# Patient Record
Sex: Male | Born: 1965 | Race: White | Hispanic: No | Marital: Married
Health system: Southern US, Community
[De-identification: ages and names within clinical notes are randomized; demographics above are authoritative.]

---

## 2021-06-02 ENCOUNTER — Other Ambulatory Visit (HOSPITAL_COMMUNITY): Payer: Self-pay | Admitting: Cardiology

## 2021-06-02 DIAGNOSIS — I517 Cardiomegaly: Secondary | ICD-10-CM

## 2021-06-02 DIAGNOSIS — I272 Pulmonary hypertension, unspecified: Secondary | ICD-10-CM

## 2021-06-22 ENCOUNTER — Telehealth (HOSPITAL_COMMUNITY): Payer: Self-pay | Admitting: Emergency Medicine

## 2021-06-22 NOTE — Telephone Encounter (Signed)
Reaching out to patient to offer assistance regarding upcoming cardiac imaging study; pt verbalizes understanding of appt date/time, parking situation and where to check in and verified current allergies; name and call back number provided for further questions should they arise Marchia Bond RN Navigator Cardiac Imaging [facility] [phone] office [phone] cell   Denies metal implants Denies iv issues Denies claustrophobia Arrival time 1130

## 2021-06-23 ENCOUNTER — Ambulatory Visit (HOSPITAL_COMMUNITY): Payer: BC Managed Care – PPO

## 2021-07-27 ENCOUNTER — Telehealth (HOSPITAL_COMMUNITY): Payer: Self-pay | Admitting: *Deleted

## 2021-07-27 NOTE — Telephone Encounter (Signed)
Reaching out to patient to offer assistance regarding upcoming cardiac imaging study; pt verbalizes understanding of appt date/time, parking situation and where to check in, and verified current allergies; name and call back number provided for further questions should they arise  Trew Sunde RN Navigator Cardiac Imaging [facility] [phone] office [phone] cell  Patient denies metal or claustrophobia. 

## 2021-07-28 ENCOUNTER — Other Ambulatory Visit: Payer: Self-pay

## 2021-07-28 ENCOUNTER — Ambulatory Visit (HOSPITAL_COMMUNITY)
Admission: RE | Admit: 2021-07-28 | Discharge: 2021-07-28 | Disposition: A | Discharge status: Home / Self Care | Payer: BC Managed Care – PPO | Source: Ambulatory Visit | Attending: Cardiology | Admitting: Cardiology

## 2021-07-28 DIAGNOSIS — I517 Cardiomegaly: Secondary | ICD-10-CM | POA: Diagnosis not present

## 2021-07-28 DIAGNOSIS — I272 Pulmonary hypertension, unspecified: Secondary | ICD-10-CM | POA: Insufficient documentation

## 2021-07-28 MED ORDER — GADOBUTROL 1 MMOL/ML IV SOLN
10.0000 mL | Freq: Once | INTRAVENOUS | Status: AC | PRN
  Administered 2021-07-28: 10 mL via INTRAVENOUS

## 2024-01-29 IMAGING — MR MR CARD MORPHOLOGY WO/W CM
45 of 48 series · 45 of 48 positions shown · IV contrast (gadavist)
Comparison: none

CLINICAL DATA: Clinical question of right ventricular dilation
Study assumes HCT of 42 and BSA of 2.18 m2.

EXAM:
CARDIAC MRI
TECHNIQUE: The patient was scanned on a 1.5 Tesla GE magnet. A dedicated
cardiac coil was used. Functional imaging was done using [employer]
sequences. [DATE], and 4 chamber views were done to assess for RWMA's.
Modified Shawnjay rule using a short axis stack was used to
calculate an ejection fraction on a dedicated work station using
[employer] software. The patient received 10 cc of Gadavist. After 10
minutes inversion recovery sequences were used to assess for
infiltration and scar tissue.
CONTRAST:  10 cc  of Gadavist

[Series 4: t2_haste_db_tra_bh · axial · 8.0mm · 1.56mm/px · 1 of 16 slices shown]
[im 1/16]
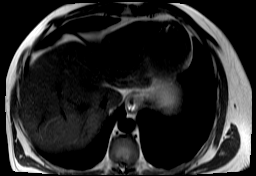

[Series 8: bSSFP · oblique · 8.0mm · 1.70mm/px · 1 of 25 slices shown (1 of 23)]
[im 1/25]
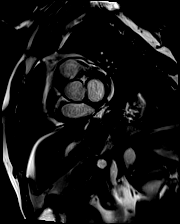

[Series 9: bSSFP · oblique · 8.0mm · 1.70mm/px · 1 of 25 slices shown (2 of 23)]
[im 1/25]
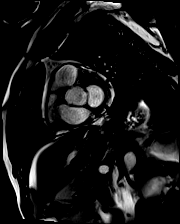

[Series 10: bSSFP · oblique · 8.0mm · 1.70mm/px · 1 of 25 slices shown (3 of 23)]
[im 1/25]
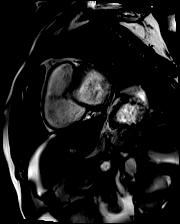

[Series 11: bSSFP · oblique · 8.0mm · 1.70mm/px · 1 of 25 slices shown (4 of 23)]
[im 1/25]
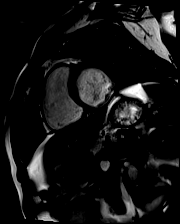

[Series 12: bSSFP · oblique · 8.0mm · 1.70mm/px · 1 of 25 slices shown (5 of 23)]
[im 1/25]
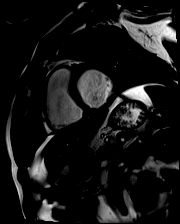

[Series 13: bSSFP · oblique · 8.0mm · 1.70mm/px · 1 of 25 slices shown (6 of 23)]
[im 1/25]
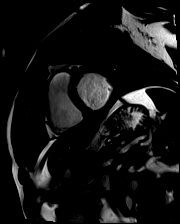

[Series 14: bSSFP · oblique · 8.0mm · 1.70mm/px · 1 of 25 slices shown (7 of 23)]
[im 1/25]
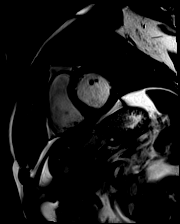

[Series 15: bSSFP · oblique · 8.0mm · 1.70mm/px · 1 of 25 slices shown (8 of 23)]
[im 1/25]
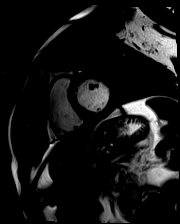

[Series 16: bSSFP · oblique · 8.0mm · 1.70mm/px · 1 of 25 slices shown (9 of 23)]
[im 1/25]
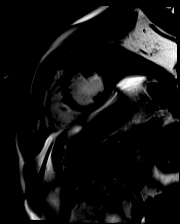

[Series 17: bSSFP · oblique · 8.0mm · 1.70mm/px · 1 of 25 slices shown (10 of 23)]
[im 1/25]
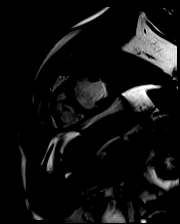

[Series 18: bSSFP · oblique · 8.0mm · 1.70mm/px · 1 of 25 slices shown (11 of 23)]
[im 1/25]
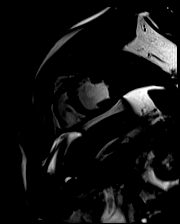

[Series 19: bSSFP · oblique · 8.0mm · 1.70mm/px · 1 of 25 slices shown (12 of 23)]
[im 1/25]
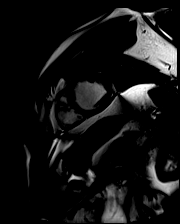

[Series 20: bSSFP · oblique · 8.0mm · 1.70mm/px · 1 of 25 slices shown (13 of 23)]
[im 1/25]
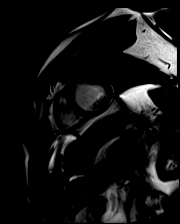

[Series 21: bSSFP · oblique · 8.0mm · 1.70mm/px · 1 of 25 slices shown (14 of 23)]
[im 1/25]
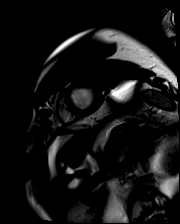

[Series 22: bSSFP · oblique · 8.0mm · 1.70mm/px · 1 of 25 slices shown (15 of 23)]
[im 1/25]
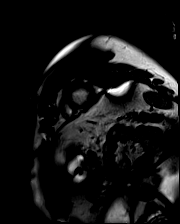

[Series 23: bSSFP · oblique · 8.0mm · 1.70mm/px · 1 of 25 slices shown (16 of 23)]
[im 1/25]
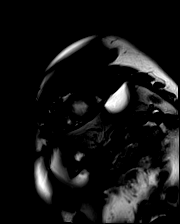

[Series 24: bSSFP · oblique · 8.0mm · 1.70mm/px · 1 of 25 slices shown (17 of 23)]
[im 1/25]
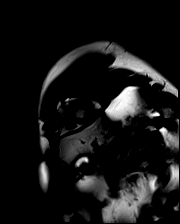

[Series 25: bSSFP · oblique · 8.0mm · 1.70mm/px · 1 of 25 slices shown (18 of 23)]
[im 1/25]
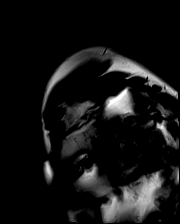

[Series 26: bSSFP · oblique · 8.0mm · 1.70mm/px · 1 of 25 slices shown (19 of 23)]
[im 1/25]
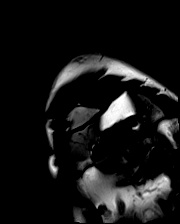

[Series 27: bSSFP · oblique · 6.0mm · 1.41mm/px · 1 of 25 slices shown (20 of 23)]
[im 1/25]
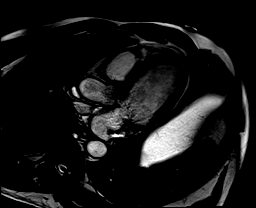

[Series 28: bSSFP · oblique · 6.0mm · 1.41mm/px · 1 of 25 slices shown (21 of 23)]
[im 1/25]
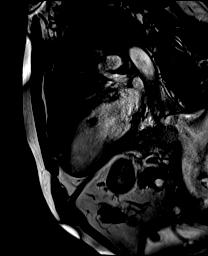

[Series 29: bSSFP · axial · 6.0mm · 1.41mm/px · 1 of 25 slices shown (22 of 23)]
[im 1/25]
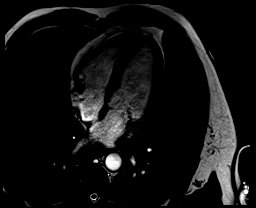

[Series 30: bSSFP · oblique · 6.0mm · 1.41mm/px · 1 of 25 slices shown (23 of 23)]
[im 1/25]
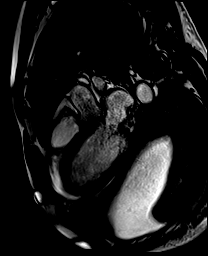

[Series 31: (id)_long_t1 · oblique · 8.0mm · 1.56mm/px · 1 of 24 slices shown]
[im 1/24]
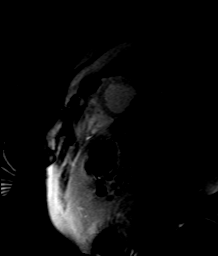

[Series 32: (id)_long_t1_moco · oblique · 8.0mm · 1.56mm/px · 1 of 24 slices shown]
[im 1/24]
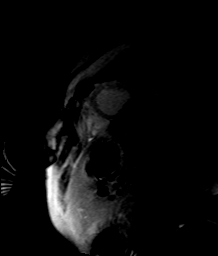

[Series 33: (id)_long_t1_moco_t1 · oblique · 8.0mm · 1.56mm/px · 1 of 3 slices shown]
[im 1/3]
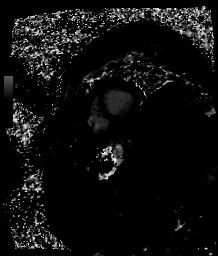

[Series 35: (id)_trufi · oblique · 8.0mm · 2.08mm/px · 1 of 9 slices shown]
[im 1/9]
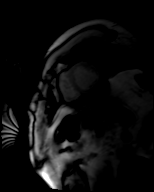

[Series 36: (id)_trufi_moco · oblique · 8.0mm · 2.08mm/px · 1 of 9 slices shown]
[im 1/9]
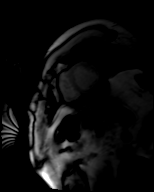

[Series 37: (id)_trufi_moco_t2 · oblique · 8.0mm · 2.08mm/px · 1 of 3 slices shown]
[im 1/3]
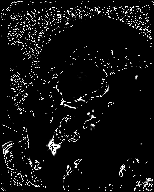

[Series 39: cine_trufi_short axis_cs_2_shot · oblique · 8.0mm · 1.48mm/px · 1 of 25 slices shown (1 of 15)]
[im 1/25]
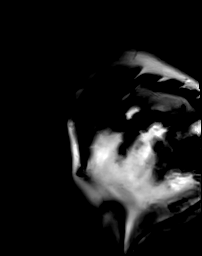

[Series 39: cine_trufi_short axis_cs_2_shot · oblique · 8.0mm · 1.48mm/px · 1 of 25 slices shown (2 of 15)]
[im 1/25]
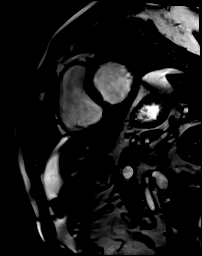

[Series 39: cine_trufi_short axis_cs_2_shot · oblique · 8.0mm · 1.48mm/px · 1 of 25 slices shown (3 of 15)]
[im 1/25]
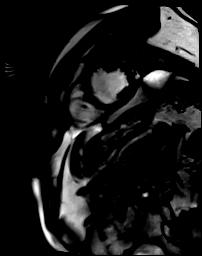

[Series 39: cine_trufi_short axis_cs_2_shot · oblique · 8.0mm · 1.48mm/px · 1 of 25 slices shown (4 of 15)]
[im 1/25]
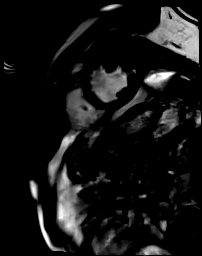

[Series 39: cine_trufi_short axis_cs_2_shot · oblique · 8.0mm · 1.48mm/px · 1 of 25 slices shown (5 of 15)]
[im 1/25]
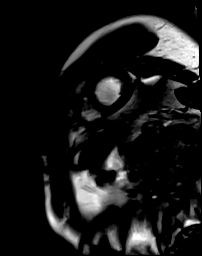

[Series 39: cine_trufi_short axis_cs_2_shot · oblique · 8.0mm · 1.48mm/px · 1 of 25 slices shown (6 of 15)]
[im 1/25]
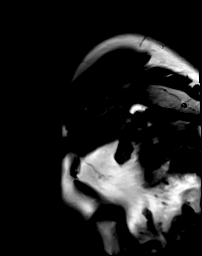

[Series 39: cine_trufi_short axis_cs_2_shot · oblique · 8.0mm · 1.48mm/px · 1 of 25 slices shown (7 of 15)]
[im 1/25]
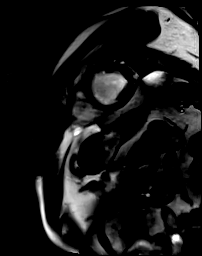

[Series 39: cine_trufi_short axis_cs_2_shot · oblique · 8.0mm · 1.48mm/px · 1 of 25 slices shown (8 of 15)]
[im 1/25]
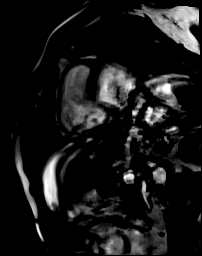

[Series 39: cine_trufi_short axis_cs_2_shot · oblique · 8.0mm · 1.48mm/px · 1 of 25 slices shown (9 of 15)]
[im 1/25]
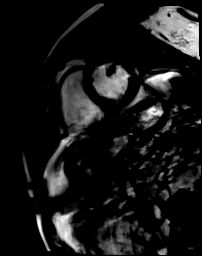

[Series 39: cine_trufi_short axis_cs_2_shot · oblique · 8.0mm · 1.48mm/px · 1 of 25 slices shown (10 of 15)]
[im 1/25]
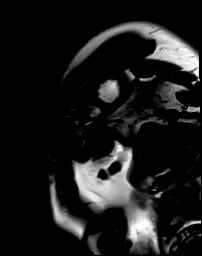

[Series 39: cine_trufi_short axis_cs_2_shot · oblique · 8.0mm · 1.48mm/px · 1 of 25 slices shown (11 of 15)]
[im 1/25]
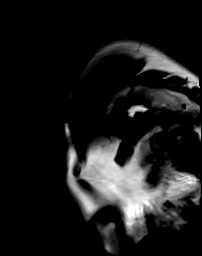

[Series 39: cine_trufi_short axis_cs_2_shot · oblique · 8.0mm · 1.48mm/px · 1 of 25 slices shown (12 of 15)]
[im 1/25]
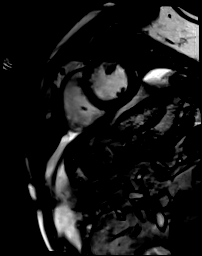

[Series 39: cine_trufi_short axis_cs_2_shot · oblique · 8.0mm · 1.48mm/px · 1 of 25 slices shown (13 of 15)]
[im 1/25]
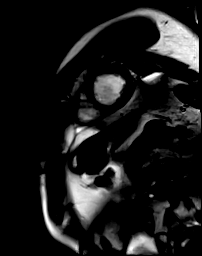

[Series 39: cine_trufi_short axis_cs_2_shot · oblique · 8.0mm · 1.48mm/px · 1 of 25 slices shown (14 of 15)]
[im 1/25]
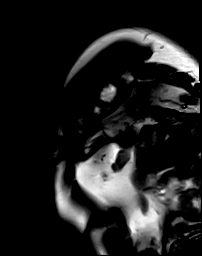

[Series 39: cine_trufi_short axis_cs_2_shot · oblique · 8.0mm · 1.48mm/px · 1 of 25 slices shown (15 of 15)]
[im 1/25]
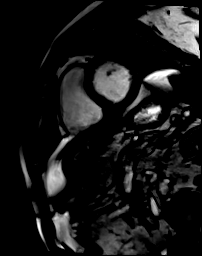

[45 of 48 positions shown; findings below may reference images not displayed]

FINDINGS: 1. Mildly increased left ventricular size, with LVEDD 58 mm, but
LVEDVi 109 mL/m2, absolute volume 237 mL.

Basal septal hypertrophy without LVOT obstruction, with
intraventricular septal thickness of 10 mm, posterior wall thickness
of 5 mm, and myocardial mass index of 68 g/m2.

Normal left ventricular systolic function (LVEF =63%). There are no
regional wall motion abnormalities.

Left ventricular parametric mapping notable for normal T2 signal,
normal ECV signal.

There is no late gadolinium enhancement in the left ventricular
myocardium.

2. Mildly dilated right ventricular size with RVEDVI 137 mL/m2,
absolute volume 298 mL.

Normal right ventricular thickness.

Normal right ventricular systolic function (RVEF =65%). There are no
regional wall motion abnormalities or aneurysms.

3.  Normal left and right atrial size.

4.  Normal size of the aortic root, ascending aorta.

Dilation of the main pulmonary artery, mild to moderate, with
diameter of 31 mm but with trunk to aorta ratio of 0.86.

5. Valve assessment:

Aortic Valve: Tri-leaflet aortic valve. There is eccentric, mild
aortic regurgitation. Regurgitant fraction 5%, mean gradient 4 mm
Hg.

Pulmonic Valve: There is no significant regurgitation. Regurgitant
fraction 2%, mean gradient < 1 mm Hg.

Tricuspid Valve: Qualitatively, there is no significant
regurgitation.

Mitral Valve: Anterior mitral valve prolapse. There is moderate
mitral regurgitation, regurgitant fraction 27%.

6. Normal pericardium. No pericardial effusion. Prominent
pericardial fat overlying the right ventricular without fatty
infiltration of the myocardium.

7. Grossly, no extracardiac findings. Recommended dedicated study if
concerned for non-cardiac pathology.
IMPRESSION: Mild left ventricular and right ventricular dilation.

Mild to moderate pulmonary artery dilation. This can be associated
with the presence of pulmonary hypertension; clinical correlation
advised.
